# Patient Record
Sex: Female | Born: 1960 | Race: White | Hispanic: No | Marital: Married | Smoking: Current some day smoker
Health system: Southern US, Community
[De-identification: ages and names within clinical notes are randomized; demographics above are authoritative.]

## PROBLEM LIST (undated history)

## (undated) HISTORY — PX: ABDOMINAL HYSTERECTOMY: SUR658

## (undated) HISTORY — PX: CHOLECYSTECTOMY: SHX55

---

## 2011-12-03 LAB — CK TOTAL AND CKMB (NOT AT ARMC)
CK, Total: 129 U/L (ref 21–215)
CK-MB: 0.8 ng/mL (ref 0.5–3.6)

## 2011-12-03 LAB — CBC
HCT: 35.5 % (ref 35.0–47.0)
MCH: 31.4 pg (ref 26.0–34.0)
MCHC: 33.9 g/dL (ref 32.0–36.0)
Platelet: 182 10*3/uL (ref 150–440)
RDW: 13.5 % (ref 11.5–14.5)
WBC: 6.1 10*3/uL (ref 3.6–11.0)

## 2011-12-03 LAB — COMPREHENSIVE METABOLIC PANEL
Albumin: 3.4 g/dL (ref 3.4–5.0)
Anion Gap: 10 (ref 7–16)
BUN: 12 mg/dL (ref 7–18)
Bilirubin,Total: 0.2 mg/dL (ref 0.2–1.0)
Chloride: 110 mmol/L — ABNORMAL HIGH (ref 98–107)
EGFR (Non-African Amer.): >60
Glucose: 108 mg/dL — ABNORMAL HIGH (ref 65–99)
Osmolality: 285 (ref 275–301)
Potassium: 3.3 mmol/L — ABNORMAL LOW (ref 3.5–5.1)
SGPT (ALT): 60 U/L
Total Protein: 6.5 g/dL (ref 6.4–8.2)

## 2011-12-03 LAB — PROTIME-INR
INR: 0.9
Prothrombin Time: 12.7 secs (ref 11.5–14.7)

## 2011-12-04 ENCOUNTER — Observation Stay: Payer: Self-pay | Admitting: Specialist

## 2011-12-04 LAB — COMPREHENSIVE METABOLIC PANEL
Albumin: 3.3 g/dL — ABNORMAL LOW (ref 3.4–5.0)
Alkaline Phosphatase: 91 U/L (ref 50–136)
Anion Gap: 8 (ref 7–16)
Bilirubin,Total: 0.2 mg/dL (ref 0.2–1.0)
Chloride: 109 mmol/L — ABNORMAL HIGH (ref 98–107)
Co2: 25 mmol/L (ref 21–32)
Creatinine: 0.59 mg/dL — ABNORMAL LOW (ref 0.60–1.30)
Potassium: 3.8 mmol/L (ref 3.5–5.1)
SGOT(AST): 57 U/L — ABNORMAL HIGH (ref 15–37)
SGPT (ALT): 52 U/L
Sodium: 142 mmol/L (ref 136–145)

## 2011-12-04 LAB — DRUG SCREEN, URINE
Barbiturates, Ur Screen: NEGATIVE (ref ?–200)
Cannabinoid 50 Ng, Ur ~~LOC~~: NEGATIVE (ref ?–50)
MDMA (Ecstasy)Ur Screen: NEGATIVE (ref ?–500)
Methadone, Ur Screen: NEGATIVE (ref ?–300)
Phencyclidine (PCP) Ur S: NEGATIVE (ref ?–25)

## 2011-12-04 LAB — LIPID PANEL
Cholesterol: 146 mg/dL (ref 0–200)
HDL Cholesterol: 60 mg/dL (ref 40–60)
Triglycerides: 91 mg/dL (ref 0–200)

## 2011-12-04 LAB — TROPONIN I
Troponin-I: <0.02 ng/mL
Troponin-I: <0.02 ng/mL

## 2011-12-04 LAB — CK TOTAL AND CKMB (NOT AT ARMC)
CK, Total: 102 U/L (ref 21–215)
CK-MB: 0.8 ng/mL (ref 0.5–3.6)
CK-MB: <0.5 ng/mL — ABNORMAL LOW (ref 0.5–3.6)

## 2014-11-29 NOTE — Discharge Summary (Signed)
PATIENT NAME:  Shelby Thompson, Shelby Thompson MR#:  [id number] DATE OF BIRTH:  [birth year]  DATE OF ADMISSION:  12/04/2011 DATE OF DISCHARGE:  12/04/2011  For a detailed note, please take a look at the history and physical done by Dr. Margie EgePhichith at admission.    DIAGNOSES AT DISCHARGE:  1. Chest pain and left arm pain, likely musculoskeletal in nature.  2. History of hypertension, noncompliant with meds. 3. Ongoing tobacco abuse.  4. Gastroesophageal reflux disease.   DIET: The patient is being discharged on a low sodium diet.   ACTIVITY: As tolerated.   FOLLOW-UP: Follow-up is with [facility] at [facility].   DISCHARGE MEDICATIONS (Both of which are new medications): 1. Metoprolol 25 mg b.i.d.  2. Omeprazole 20 mg daily.   CONSULTANT DURING THE HOSPITAL COURSE: Dr. Arnoldo HookerBruce Kowalski from Cardiology    PERTINENT STUDIES:  1. Chest x-ray done on admission showing clear lung fields with slight cardiomegaly.  2. Stress test done on 12/04/2011 showing normal Lexiscan infusion, EKG without evidence of ischemia or arrhythmia, normal LV function, EF of 62%.   HOSPITAL COURSE: This is a 54 year old female with medical problems as mentioned above who presented to the hospital complaining of chest pain.  1. Chest pain. The patient was observed overnight on telemetry, had three sets of cardiac markers checked which were negative. The patient's chest pain seemed musculoskeletal in nature as it was reproducible and worse with certain movements. The patient did undergo a stress test which showed no evidence of acute myocardial ischemia. She is, therefore, being discharged home with follow-up with her primary care physician.  2. Hypertension. The patient did have labile blood pressures. The patient says that she is supposed to be taking antihypertensives but has not been compliant with them in over four months. I did start her on some low dose metoprolol. She will follow-up with her primary care  physician regarding any further changes regarding her blood pressure meds.  3. Tobacco abuse. The patient was strongly advised to quit smoking. She was maintained on a nicotine patch while she was here.  4. Gastroesophageal reflux disease. 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 She has developed some dyspepsia indigestion to this. I did start her on some omeprazole prior to being discharged and I did advise that she limit her intake of NSAIDs and Motrin as stated.   CODE STATUS: The patient is a FULL CODE.   TIME SPENT: 35 minutes.   ____________________________ Rolly PancakeVivek J. Cherlynn KaiserSainani, MD vjs:drc D: 12/04/2011 16:51:18 ET T: 12/05/2011 11:02:07 ET JOB#: [id number]  cc: Rolly PancakeVivek J. Cherlynn KaiserSainani, MD, <Dictator> Houston SirenVIVEK J SAINANI MD ELECTRONICALLY SIGNED 12/07/2011 8:15

## 2014-11-29 NOTE — Consult Note (Signed)
PATIENT NAME:  Shelby Thompson, Shelby Thompson MR#:  [id number] DATE OF BIRTH:  April 20, 1961  DATE OF CONSULTATION:  12/04/2011  REFERRING PHYSICIAN:   CONSULTING PHYSICIAN:  Lamar Blinks, MD  PRIMARY CARE PHYSICIAN: [facility]   CHIEF COMPLAINT: "I have chest pain."   HISTORY OF PRESENT ILLNESS: This is a 54 year old female with known hypertension, hyperlipidemia, off and on medications but reasonably well stable, had some cardiac work-up last year with a stress test, but no evidence of cardiac catheterization at that time. At that time, she had chest pain and it was called stress. Since then she has had no evidence of significant issues. She was sitting in her chair having substernal chest discomfort, diaphoresis, shortness of breath, and mild nausea. At that time she had left arm pain as well.  On arrival to the Emergency Room, troponin and CK-MB were within normal limits and she has had an EKG showing normal sinus rhythm, normal EKG. The patient has had no further episodes of chest discomfort since she was given some nitroglycerin and other appropriate medications. The patient has had now some continued arm pain, but no chest pain.  The remainder of review of systems is negative for vision change, ringing in the ears, hearing loss, cough, congestion, heartburn, nausea, vomiting, diarrhea, bloody stools, stomach pain, extremity pain, leg weakness, cramping in the buttocks, known blood clots, headaches, blackouts, dizzy spells, nosebleed, congestion, trouble swallowing, frequent urination, urination at night, muscle weakness, numbness, anxiety, or depression, skin lesions, or skin rashes.   PAST MEDICAL HISTORY:  1. Unstable angina.  2. Hypertension.  3. Hyperlipidemia.   FAMILY HISTORY: No family members with early onset of cardiovascular disease, but significant onset of stroke and hypertension.   SOCIAL HISTORY: Occasionally uses alcohol and tobacco.   ALLERGIES: She has no known drug allergies.   CURRENT  MEDICATIONS: As listed.   PHYSICAL EXAMINATION:  VITAL SIGNS: Blood pressure 126/68 bilaterally, heart rate 72 upright, reclining, and regular.   GENERAL: She is a well-appearing female in no acute distress.   HEENT: No icterus, thyromegaly, ulcers, hemorrhage, or xanthelasma.   HEART: Regular and rhythm with normal S1 and S2 without murmur, gallop, or rub. Point of maximal impulse is normal size and placement. Carotid upstroke normal without bruit. Jugular venous pressure is normal.   LUNGS: Lungs have few basilar crackles with normal respirations.   ABDOMEN: Soft, nontender, without hepatosplenomegaly or masses. Abdominal aorta is normal size without bruit.   EXTREMITIES: 2+ bilateral pulses in dorsal pedal, radial, and femoral arteries without lower extremity edema, cyanosis, clubbing, or ulcers.   NEUROLOGIC: She is oriented to time, place, and person with normal mood and affect.   ASSESSMENT: This is a 54 year old female with hypertension, hyperlipidemia, acute onset of substernal chest pain and diaphoresis consistent with unstable angina without evidence of current myocardial infarction.   RECOMMENDATIONS:  1. Continue current medical regimen, nitrates, oxygen and aspirin for further risk reduction of unstable angina. 2. Continue following serial ECG and enzymes for possible myocardial infarction.  3. Treadmill Myoview to assess exercise tolerance, myocardial ischemia, further treatment options.  4. Consider echocardiogram for LV systolic dysfunction and valvular heart disease.  5. Further diagnostic testing and treatment options after above.  6. Hypertension control with a goal systolic blood pressure below 811 mm with ACE inhibitor if able.   7. Continue lipid management with a goal LDL below 100 mg/dL with statin if able.      ____________________________ Lamar Blinks, MD bjk:bjt D: 12/04/2011 08:50:00  ET T: 12/04/2011 10:52:05 ET JOB#: [id number]  cc: Lamar BlinksBruce J.  Solange Emry, MD, <Dictator> Lamar BlinksBRUCE J [place] Warzecha MD ELECTRONICALLY SIGNED 12/14/2011 13:33

## 2014-11-29 NOTE — H&P (Signed)
PATIENT NAME:  Shelby Thompson, Shelby Thompson MR#:  [id number] DATE OF BIRTH:  1960/09/12  DATE OF ADMISSION:  12/03/2011  REFERRING PHYSICIAN: Dr. Mayford Knife PRIMARY CARE PHYSICIAN: [facility]  PRESENTING COMPLAINT: Chest pain, shortness of breath, nausea, vomiting, diaphoresis.   HISTORY OF PRESENT ILLNESS: Shelby Thompson is a pleasant 54 year old woman with history of hypertension, hyperlipidemia, obesity, vitamin D deficiency who presents today with reports of developing midsternal chest pain while lying on the couch watching TV described as being sharp at first with increased intensity and tightness like someone was stabbing her. She reports associated nausea, vomiting, diaphoresis, and shortness of breath. Reports the pain radiated down her left arm. She took one nitroglycerin that she was prescribed about a year ago without any help. When EMS arrived she was given aspirin and nitro spray and reports that her symptoms resolved. Denies any syncope. Reports a similar episode back in June 2012 where she was evaluated at [facility]. At that time she was working at her desk and she did have a syncopal spell. They did multiple work-ups but did not perform a catheterization at that time. Currently, she reports chest pain when she takes deep breaths and her pain is reproducible with deep palpation but has no chest pain when she is laying still.   PAST MEDICAL HISTORY:  1. Vitamin D deficiency.  2. Obesity.  3. Chronic leg pain.  4. Hypertension.  5. Hyperlipidemia.  6. Colonic polyps.  PAST SURGICAL HISTORY: Hysterectomy.   ALLERGIES: Amoxicillin and penicillin.   MEDICATIONS:  1. Vitamins over the counter.  2. Ibuprofen 800 mg b.i.d. since June 2012.   FAMILY HISTORY: Mother with stroke at the age of 40 and passed away from stroke at 81. There is significant family history of obesity. On her mother's side, there is early cardiac death between age of 57s 14s and she has a first cousin that died of a heart attack in teen years, 59  and 34 years old. Her father is still living. He is legally blind and has a blood disorder.   SOCIAL HISTORY: She lives in [place] with her husband. She smokes when she drinks, about a pack with drinking. She drinks eight beers in one night, but that is about every two weeks. Denies any drug use.   REVIEW OF SYSTEMS: CONSTITUTIONAL: No fevers, weight changes. EYES: No glaucoma, cataracts. ENT: No epistaxis, discharge. RESPIRATORY: No cough, wheezing, hemoptysis. CARDIOVASCULAR: As per history of present illness. Denies any palpitations or syncope. GASTROINTESTINAL: She endorsed nausea and vomiting with the chest pain as per history of present illness. No diarrhea, abdominal pain, hematemesis. She endorses bright red blood per rectum that has been chronic. She had a colonoscopy back in 2010 and had colonic polyps resected. GENITOURINARY: No dysuria, hematuria. ENDO: No polyuria, polydipsia. HEMATOLOGIC: No easy bruising. SKIN: No ulcers. She recently had a resolved ringworm lesion on her face and buttock. MUSCULOSKELETAL: She has chronic leg and knee pain and swelling. NEUROLOGIC: No history of strokes or seizures. PSYCH: Denies any depression or suicidal ideation.   PHYSICAL EXAMINATION:  VITAL SIGNS: Pulse 102, current pulse in the 70s, respiratory rate 18, blood pressure 162/77, saturating at 99% on room air.   GENERAL: Obese, sitting in bed in no apparent distress.   HEENT: Normocephalic, atraumatic. Pupils equal, symmetric, nonicteric. Nares without discharge. Moist mucous membrane.   NECK: Soft and supple. No adenopathy or JVP.   CARDIOVASCULAR: Non-tachy. No murmurs, rubs, or gallops.   LUNGS: Clear to auscultation bilaterally. No use of  accessory muscles or increased respiratory effort.   ABDOMEN: Soft. Positive bowel sounds. No mass appreciated.   EXTREMITIES: Trace edema bilaterally. Dorsal pedis pulses intact.   MUSCULOSKELETAL: Cannot detect any effusion. She does have pain with  palpation of her knees and reproducible pain on palpation of her chest.   NEUROLOGIC: No dysarthria or aphasia. Symmetrical strength. No focal deficits.   PSYCH: She is alert and oriented. Patient is cooperative.   LABORATORY, DIAGNOSTIC AND RADIOLOGICAL DATA: Troponin less than 0.02. CK 129, MB 0.8, INR 0.9, PTT 24.1, WBC 6.1, hemoglobin 12, hematocrit 35.5, platelets 182, MCV 93, glucose 108, BUN 12, creatinine 0.74, sodium 143, potassium 3.3, chloride 110, carbon dioxide 23, calcium 8.8, total bilirubin 0.2, alkaline phosphatase 90, ALT 60, AST 126. EKG with normal sinus rate of 67. No ST elevation or depression. She has T wave inversion in aVR and V1.   ASSESSMENT AND PLAN: Shelby Thompson is a 54 year old woman with history of hypertension, hyperlipidemia, obesity, tobacco use, alcohol use, vitamin D deficiency presenting with reports of chest pain, nausea, vomiting, diaphoresis, and shortness of breath.  1. Angina. Patient with multiple risk factors. Continue on tele. Cycle cardiac enzymes. Will start her on aspirin, metoprolol, nitroglycerin sublingual as needed, statin and morphine as needed as well as continued oxygen. Obtain cardiology consultation evaluation for potential catheterization. Will stop her ibuprofen for now. She has been on chronic ibuprofen since June 2012. I wonder if this may be some GI component. Will also start her empirically on Protonix b.i.d. Will send TSH, fasting lipid panel, and A1c for risk stratification.  2. Hypertension. She has not been compliant with medications. Reports that she is supposed to be on medications but not taking them. Unknown what medications are. Will start metoprolol for now.  3. Hyperlipidemia. Also not on medications. Will send fasting lipid panel as above, start on statin therapy.  4. Hypokalemia. Send mag level and replace as needed.  5. Elevated AST likely in the setting of alcohol use. Continue to follow.  6. Tobacco use and alcohol use. Again,  she smokes when she drinks and she does not drink daily but does drink heavily when she does. Will start on CIWA protocol and nicotine patch for now. Will also send a urine drug screen.  ____________________________ Reuel DerbyAlounthith Aliene Tamura, MD ap:cms D: 12/03/2011 23:46:18 ET T: 12/04/2011 08:20:26 ET JOB#: [id number]  cc: Pearlean BrownieAlounthith Emmitt Matthews, MD, <Dictator> [facility] MD ELECTRONICALLY SIGNED 12/06/2011 22:36

## 2018-10-24 ENCOUNTER — Ambulatory Visit
Admission: EM | Admit: 2018-10-24 | Discharge: 2018-10-24 | Disposition: A | Discharge status: Home / Self Care | Payer: BC Managed Care – PPO | Attending: Emergency Medicine | Admitting: Emergency Medicine

## 2018-10-24 ENCOUNTER — Other Ambulatory Visit: Payer: Self-pay

## 2018-10-24 DIAGNOSIS — I159 Secondary hypertension, unspecified: Secondary | ICD-10-CM

## 2018-10-24 DIAGNOSIS — F17210 Nicotine dependence, cigarettes, uncomplicated: Secondary | ICD-10-CM | POA: Diagnosis not present

## 2018-10-24 DIAGNOSIS — R05 Cough: Secondary | ICD-10-CM | POA: Diagnosis not present

## 2018-10-24 DIAGNOSIS — R0982 Postnasal drip: Secondary | ICD-10-CM

## 2018-10-24 DIAGNOSIS — R0981 Nasal congestion: Secondary | ICD-10-CM | POA: Diagnosis not present

## 2018-10-24 DIAGNOSIS — J0140 Acute pansinusitis, unspecified: Secondary | ICD-10-CM

## 2018-10-24 LAB — RAPID INFLUENZA A&B ANTIGENS (ARMC ONLY): INFLUENZA B (ARMC): NEGATIVE

## 2018-10-24 LAB — RAPID INFLUENZA A&B ANTIGENS: Influenza A (ARMC): NEGATIVE

## 2018-10-24 MED ORDER — DOXYCYCLINE HYCLATE 100 MG PO CAPS: 100.0000 mg | ORAL_CAPSULE | Freq: Two times a day (BID) | ORAL | 0 refills | Status: AC

## 2018-10-24 MED ORDER — FLUTICASONE PROPIONATE 50 MCG/ACT NA SUSP: 2.0000 | Freq: Every day | NASAL | 0 refills | Status: DC

## 2018-10-24 NOTE — Discharge Instructions (Signed)
Take the medication as written. Start Mucinex to keep the mucous thin and to decongest you.  Return to the ER if you get worse, have a fever >100.4, or for any concerns. You may take 600 mg of motrin with 1 gram of tylenol up to 3-4 times a day as needed for pain. This is an effective combination for pain.  Most sinus infections are viral and do not need antibiotics unless you have a high fever, have had this for 10 days, or you get better and then get sick again.  If you are not getting better in a day or 2, then go ahead and start the antibiotics.  Use a NeilMed sinus rinse as often as you want to to reduce nasal congestion. Follow the directions on the box.   Decrease your salt intake. diet and exercise will lower your blood pressure significantly. It is important to keep your blood pressure under good control, as having a elevated blood pressure for prolonged periods of time significantly increases your risk of stroke, heart attacks, kidney damage, eye damage, and other problems. Measure your blood pressure once a day, preferably at the same time every day. Keep a log of this and bring it to your next doctor's appointment.  Bring your blood pressure cuff as well.  Return here in a week for blood pressure recheck if you're unable to find a primary care physician by then. Return immediately to the ER if you start having chest pain, headache, problems seeing, problems talking, problems walking, if you feel like you're about to pass out, if you do pass out, if you have a seizure, or for any other concerns.  Here is a list of primary care providers who are taking new patients:  Dr. Elizabeth Sauer, Dr. Schuyler Amor [address] [place] [address] [phone]  [facility] [address] [place] [address]  [phone]  [facility] [address] [place], [place] [address] [phone]  [facility] [address] [place]  [phone] [place], [place]  [address]  Here are clinics/ other resources who will see you if you do not have insurance. Some have certain criteria that you must meet. Call them and find out what they are:  [facility]: [address] [place], [place] [address] Phone: [phone] Hours: First and Third Saturdays of each Month, 9 a.m. - 1 p.m.  [facility]: [address] [place], [place] [address] Phone: [phone] Hours: Tuesday, 4 p.m. - 8 p.m. Thursday, 1 p.m. - 8 p.m. Wednesday, 9 a.m. - [facility] [address], [place], [place] [address] Phone: [phone] Pharmacy Phone Number: [phone] Dental Phone Number: [phone] [facility] Insurance Help: [phone]  Dental Hours: Monday - Thursday, 8 a.m. - 6 p.m.  [facility] [address] [place], [place] [address] Phone: [phone] Pharmacy Phone Number: [phone] [facility] Insurance Help: [phone]  [facility] [address] [place] [place], [place] [address] Phone: [phone] Pharmacy Phone Number: [phone] [facility] Insurance Help: [phone]  [facility] [address] [place], [place] [address] Phone: [phone] [facility] Insurance Help: [phone]   [facility] [address], [place], [place] [address] Phone: [phone]  Go to www.goodrx.com to look up your medications. This will give you a list of where you can find your prescriptions at the most affordable prices. Or ask the pharmacist what the cash price is, or if  they have any other discount programs available to help make your medication more affordable. This can be less expensive than what you would pay with insurance.

## 2018-10-24 NOTE — ED Provider Notes (Signed)
HPI  SUBJECTIVE:  Shelby Thompson is a 57 y.o. female who presents with "URI" symptoms starting 3 days ago.  She reports fevers T-max 101, nasal congestion, green rhinorrhea.  She reports a cough productive of greenish phlegm primarily in the morning.  She reports maxillary sinus pain and pressure, mild facial swelling, postnasal drip.  She reports chest congestion, wheezing in the morning secondary to the postnasal drip.  No sore throat.  No body aches, headaches, chest pain, shortness of breath, dyspnea on exertion.  She did not get a flu shot this year.  No contacts with flu.  No antibiotics in the past month.  No antipyretic in the past 4 to 6 hours.  No recent travel, no exposure to COVID 19 patient.  She tried TheraFlu without improvement in her symptoms.  Symptoms are worse with lying down.  She has a past medical history of asthma "years ago" and has not needed an inhaler in 20 years.  She is an occasional smoker.  She has a history of hypertension, but has not taken her medications in a month.  She also has a history of frequent sinusitis.  No history of diabetes.  PMD: Urgent care in [place].  Is looking for new primary care provider.   History reviewed. No pertinent past medical history.  Past Surgical History:  Procedure Laterality Date  . ABDOMINAL HYSTERECTOMY    . CHOLECYSTECTOMY      Family History  Problem Relation Age of Onset  . Breast cancer Mother   . Bone cancer Father     Social History   Tobacco Use  . Smoking status: Current Some Day Smoker    Types: Cigarettes  . Smokeless tobacco: Never Used  Substance Use Topics  . Alcohol use: Yes    Comment: rarely  . Drug use: Never    No current facility-administered medications for this encounter.   Current Outpatient Medications:  .  doxycycline (VIBRAMYCIN) 100 MG capsule, Take 1 capsule (100 mg total) by mouth 2 (two) times daily for 7 days., Disp: 14 capsule, Rfl: 0 .  fluticasone (FLONASE) 50 MCG/ACT nasal spray,  Place 2 sprays into both nostrils daily., Disp: 16 g, Rfl: 0  Allergies  Allergen Reactions  . Amoxicillin Hives     ROS  As noted in HPI.   Physical Exam  BP (!) 162/72 (BP Location: Left Arm)   Pulse 70   Temp 98.9 F (37.2 C) (Oral)   Resp 16   Ht 5\' 2"  (1.575 m)   Wt 93 kg   SpO2 98%   BMI 37.49 kg/m   Constitutional: Well developed, well nourished, no acute distress Eyes:  EOMI, conjunctiva normal bilaterally HENT: Normocephalic, atraumatic,mucus membranes moist.  Positive nasal congestion.  Normal turbinates.  Positive maxillary and frontal sinus tenderness.  Normal oropharynx without cobblestoning or postnasal drip. Respiratory: Normal inspiratory effort, lungs clear bilaterally, good air movement Cardiovascular: Normal rate, regular rhythm, no murmurs rubs or gallops GI: nondistended skin: No rash, skin intact Musculoskeletal: no deformities Neurologic: Alert & oriented x 3, no focal neuro deficits Psychiatric: Speech and behavior appropriate   ED Course   Medications - No data to display  Orders Placed This Encounter  Procedures  . Rapid Influenza A&B Antigens ([facility] only)    Standing Status:   Standing    Number of Occurrences:   1  . Droplet precaution    Standing Status:   Standing    Number of Occurrences:   1  Results for orders placed or performed during the hospital encounter of 10/24/18 (from the past 24 hour(s))  Rapid Influenza A&B Antigens ([facility] only)     Status: None   Collection Time: 10/24/18 10:07 AM  Result Value Ref Range   Influenza A ([facility]) NEGATIVE NEGATIVE   Influenza B ([facility]) NEGATIVE NEGATIVE   No results found.  ED Clinical Impression  Acute non-recurrent pansinusitis  Secondary hypertension   ED Assessment/Plan  Checking flu.  If negative, plan below.  1.  URI/sinusitis.  Most likely viral at this point in time but given the sinus tenderness and the severity of symptoms that she is reporting, will send home  with see prescription of doxycycline in addition to Flonase, saline nasal irrigation, plain Mucinex, Tylenol/ibuprofen 3 or 4 times a day as needed for fever, headache.  Rapid flu negative.  Plan as above  2.  Hypertension.  Currently asymptomatic.  Advised her to restart her blood pressure medicine.  Advised her to buy a blood pressure cuff and keep a log of her blood pressure.  We will have her follow-up with a primary care physician of her choice for ongoing management of this.  Discussed labs, MDM, treatment plan, and plan for follow-up with patient. Discussed sn/sx that should prompt return to the ED. patient agrees with plan.   Meds ordered this encounter  Medications  . fluticasone (FLONASE) 50 MCG/ACT nasal spray    Sig: Place 2 sprays into both nostrils daily.    Dispense:  16 g    Refill:  0  . doxycycline (VIBRAMYCIN) 100 MG capsule    Sig: Take 1 capsule (100 mg total) by mouth 2 (two) times daily for 7 days.    Dispense:  14 capsule    Refill:  0    *This clinic note was created using Scientist, clinical (histocompatibility and immunogenetics). Therefore, there may be occasional mistakes despite careful proofreading.   ?   Domenick Gong, MD 10/24/18 1054

## 2018-10-24 NOTE — ED Triage Notes (Signed)
Patient complains of cough, congestion, fever, nasal congestion x 1 week.

## 2018-11-26 ENCOUNTER — Encounter: Payer: Self-pay | Admitting: Emergency Medicine

## 2018-11-26 ENCOUNTER — Other Ambulatory Visit: Payer: Self-pay

## 2018-11-26 ENCOUNTER — Ambulatory Visit
Admission: EM | Admit: 2018-11-26 | Discharge: 2018-11-26 | Disposition: A | Discharge status: Home / Self Care | Payer: BC Managed Care – PPO | Attending: Family Medicine | Admitting: Family Medicine

## 2018-11-26 DIAGNOSIS — M545 Low back pain, unspecified: Secondary | ICD-10-CM

## 2018-11-26 MED ORDER — MELOXICAM 15 MG PO TABS: 15.0000 mg | ORAL_TABLET | Freq: Every day | ORAL | 0 refills | Status: DC | PRN

## 2018-11-26 MED ORDER — METAXALONE 800 MG PO TABS: 800.0000 mg | ORAL_TABLET | Freq: Three times a day (TID) | ORAL | 0 refills | Status: DC | PRN

## 2018-11-26 NOTE — ED Provider Notes (Signed)
[facility]    CSN: [id number] Arrival date & time: 11/26/18  0859  History   Chief Complaint Chief Complaint  Patient presents with  . Back Pain   HPI  58 year old female presents with low back pain.  Patient recently seen and admitted to the hospital in January for back pain.  MRI revealed degenerative changes as well as retrolisthesis and disc bulge at L5-S1.  Patient states that she was discharged home on physical therapy.  She states that she has since ceased physical therapy.  Patient states that she was doing a cardiac cath this morning.  She got out of bed and had severe pain when she stepped down from her bed.  Pain is located bilaterally, lumbar spine.  No reports of difficulty urinating.  No incontinence.  No saddle anesthesia.  No numbness or tingling.  Pain is severe.  Worse with activity/movement.  No relieving factors.  No medications or interventions tried.  No other complaints.  PMH, Surgical Hx, Family Hx, Social History reviewed and updated as below.  PMH: Depression, HLD, HTN, Migraine  Past Surgical History:  Procedure Laterality Date  . ABDOMINAL HYSTERECTOMY    . CHOLECYSTECTOMY     Home Medications    Prior to Admission medications   Medication Sig Start Date End Date Taking? Authorizing Provider  fluticasone (FLONASE) 50 MCG/ACT nasal spray Place 2 sprays into both nostrils daily. 10/24/18   Domenick Gong, MD  meloxicam (MOBIC) 15 MG tablet Take 1 tablet (15 mg total) by mouth daily as needed for pain. 11/26/18   Tommie Sams, DO  metaxalone (SKELAXIN) 800 MG tablet Take 1 tablet (800 mg total) by mouth 3 (three) times daily as needed for muscle spasms. 11/26/18   Tommie Sams, DO    Family History Family History  Problem Relation Age of Onset  . Breast cancer Mother   . Bone cancer Father     Social History Social History   Tobacco Use  . Smoking status: Current Some Day Smoker    Types: Cigarettes  . Smokeless tobacco: Never  Used  Substance Use Topics  . Alcohol use: Yes    Comment: rarely  . Drug use: Never     Allergies   Amoxicillin   Review of Systems Review of Systems  Constitutional: Negative.   Musculoskeletal: Positive for back pain.  Neurological: Negative.    Physical Exam Triage Vital Signs ED Triage Vitals  Enc Vitals Group     BP 11/26/18 0917 (!) 158/91     Pulse Rate 11/26/18 0917 67     Resp 11/26/18 0917 18     Temp 11/26/18 0917 97.9 F (36.6 C)     Temp Source 11/26/18 0917 Oral     SpO2 11/26/18 0917 97 %     Weight 11/26/18 0916 204 lb (92.5 kg)     Height 11/26/18 0916 5\' 2"  (1.575 m)     Head Circumference --      Peak Flow --      Pain Score 11/26/18 0915 10     Pain Loc --      Pain Edu? --      Excl. in GC? --    Updated Vital Signs BP (!) 158/91 (BP Location: Right Arm)   Pulse 67   Temp 97.9 F (36.6 C) (Oral)   Resp 18   Ht 5\' 2"  (1.575 m)   Wt 92.5 kg   SpO2 97%   BMI 37.31 kg/m  Visual Acuity Right Eye Distance:   Left Eye Distance:   Bilateral Distance:    Right Eye Near:   Left Eye Near:    Bilateral Near:     Physical Exam Vitals signs and nursing note reviewed.  Constitutional:      General: She is not in acute distress.    Appearance: Normal appearance. She is obese.  HENT:     Head: Normocephalic and atraumatic.  Eyes:     General:        Right eye: No discharge.        Left eye: No discharge.     Conjunctiva/sclera: Conjunctivae normal.  Cardiovascular:     Rate and Rhythm: Normal rate and regular rhythm.  Pulmonary:     Effort: Pulmonary effort is normal.     Breath sounds: Normal breath sounds.  Musculoskeletal:     Comments: Lumbar spine with Paraspinal muscular tension and tenderness to palpation.  Neurological:     Mental Status: She is alert.  Psychiatric:        Mood and Affect: Mood normal.        Behavior: Behavior normal.    UC Treatments / Results  Labs (all labs ordered are listed, but only abnormal  results are displayed) Labs Reviewed - No data to display  EKG None  Radiology No results found.  Procedures Procedures (including critical care time)  Medications Ordered in UC Medications - No data to display  Initial Impression / Assessment and Plan / UC Course  I have reviewed the triage vital signs and the nursing notes.  Pertinent labs & imaging results that were available during my care of the patient were reviewed by me and considered in my medical decision making (see chart for details).     58 year old female presents with low back pain.  Treating with meloxicam and Skelaxin.  Final Clinical Impressions(s) / UC Diagnoses   Final diagnoses:  Bilateral low back pain without sciatica, unspecified chronicity     Discharge Instructions     Rest.  Heat.  Medication as directed.  Take care  Dr. Adriana Simasook    ED Prescriptions    Medication Sig Dispense Auth. Provider   meloxicam (MOBIC) 15 MG tablet Take 1 tablet (15 mg total) by mouth daily as needed for pain. 30 tablet Anyssa Sharpless G, DO   metaxalone (SKELAXIN) 800 MG tablet Take 1 tablet (800 mg total) by mouth 3 (three) times daily as needed for muscle spasms. 30 tablet Tommie Samsook, Tarvaris Puglia G, DO     Controlled Substance Prescriptions [place] Controlled Substance Registry consulted? Not Applicable   Tommie SamsCook, Jaxsen Bernhart G, DO 11/26/18 1030

## 2018-11-26 NOTE — Discharge Instructions (Signed)
Rest.  Heat.  Medication as directed.  Take care  Dr. Kynzee Devinney  

## 2018-11-26 NOTE — ED Triage Notes (Signed)
Patient c/o back pain that started this morning after stepping down from her bed. She has had problems with her back before.

## 2018-12-16 ENCOUNTER — Other Ambulatory Visit: Payer: Self-pay | Admitting: Family Medicine

## 2024-08-01 ENCOUNTER — Ambulatory Visit
Admission: EM | Admit: 2024-08-01 | Discharge: 2024-08-01 | Disposition: A | Discharge status: Home / Self Care | Attending: Nurse Practitioner | Admitting: Nurse Practitioner

## 2024-08-01 DIAGNOSIS — I10 Essential (primary) hypertension: Secondary | ICD-10-CM

## 2024-08-01 DIAGNOSIS — R079 Chest pain, unspecified: Secondary | ICD-10-CM | POA: Diagnosis not present

## 2024-08-01 DIAGNOSIS — J09X2 Influenza due to identified novel influenza A virus with other respiratory manifestations: Secondary | ICD-10-CM | POA: Diagnosis not present

## 2024-08-01 DIAGNOSIS — G43019 Migraine without aura, intractable, without status migrainosus: Secondary | ICD-10-CM | POA: Diagnosis not present

## 2024-08-01 DIAGNOSIS — R52 Pain, unspecified: Secondary | ICD-10-CM

## 2024-08-01 LAB — POCT INFLUENZA A/B
Influenza A, POC: POSITIVE — AB
Influenza B, POC: NEGATIVE

## 2024-08-01 MED ORDER — IBUPROFEN 800 MG PO TABS: 800.0000 mg | ORAL_TABLET | Freq: Three times a day (TID) | ORAL | 0 refills | Status: AC | PRN

## 2024-08-01 MED ORDER — OSELTAMIVIR PHOSPHATE 75 MG PO CAPS: 75.0000 mg | ORAL_CAPSULE | Freq: Two times a day (BID) | ORAL | 0 refills | Status: AC

## 2024-08-01 MED ORDER — KETOROLAC TROMETHAMINE 30 MG/ML IJ SOLN
30.0000 mg | Freq: Once | INTRAMUSCULAR | Status: AC
  Administered 2024-08-01: 30 mg via INTRAMUSCULAR

## 2024-08-01 MED ORDER — CETIRIZINE-PSEUDOEPHEDRINE ER 5-120 MG PO TB12: 1.0000 | ORAL_TABLET | Freq: Every day | ORAL | 0 refills | Status: AC

## 2024-08-01 MED ORDER — BUTALBITAL-APAP-CAFFEINE 50-325-40 MG PO TABS: 1.0000 | ORAL_TABLET | Freq: Four times a day (QID) | ORAL | 0 refills | Status: AC | PRN

## 2024-08-01 MED ORDER — AMLODIPINE BESYLATE 5 MG PO TABS: 5.0000 mg | ORAL_TABLET | Freq: Every day | ORAL | 0 refills | Status: AC

## 2024-08-01 MED ORDER — DEXAMETHASONE SOD PHOSPHATE PF 10 MG/ML IJ SOLN
10.0000 mg | Freq: Once | INTRAMUSCULAR | Status: AC
  Administered 2024-08-01: 10 mg via INTRAMUSCULAR

## 2024-08-01 NOTE — Discharge Instructions (Addendum)
 You tested positive for influenza, which is a viral illness. Antibiotics are not needed. You were prescribed Tamiflu  to help shorten the duration of symptoms and should take it as directed. Rest, good hydration, and time are important for recovery. Warm fluids, a humidifier, and saline nasal spray may help with congestion. Small, frequent sips of fluids can help if postnasal drainage causes nausea.  You were treated in the clinic for a migraine headache, and symptoms should continue to improve over the next 24 to 48 hours. Rest in a quiet, dark environment and stay well hydrated. Fioricet has been prescribed. You may take this as needed for headache only. The prescribed ibuprofen  can be taken for any fevers or body aches.   Your chest pain was evaluated and does not appear to be heart-related at this time. Your EKG was normal, and the pain is most likely related to your flu symptoms. Your blood pressure was elevated today without signs of an emergency. You should restart your amlodipine  and take it daily as prescribed, along with following a heart-healthy, low-salt diet.  Follow up with your primary care provider within 1 to 2 weeks for blood pressure management or sooner if symptoms do not improve. Go to the emergency department if you develop worsening shortness of breath, persistent or worsening chest pain, new weakness or numbness, confusion, fainting, persistent high fever, severe headache that does not improve, or inability to keep fluids down.

## 2024-08-01 NOTE — ED Provider Notes (Signed)
 " [facility]    CSN: [id number] Arrival date & time: 08/01/24  9166      History   Chief Complaint Chief Complaint  Patient presents with   Cough   Chest Pain    HPI Shelby Thompson is a 63 y.o. female.   Discussed the use of AI scribe software for clinical note transcription with the patient, who gave verbal consent to proceed.   The patient with a history of hypertension presents with acute onset of flu-like symptoms that began two days ago. She reports chills and generalized body aches. She also endorses chest pain located in the mid-sternal region, described as a stick and needle sensation, with intermittent radiation down the left arm. The chest pain is not constant and she notes partial relief with shaking her arms.  She reports significant nasal congestion, rhinorrhea, and frequent sneezing, as well as postnasal drainage associated with nausea. She has experienced a persistent migraine headache since symptom onset. She denies fever, sore throat, vomiting, diarrhea, wheezing, shortness of breath, significant cough (occurs only occasionally), numbness or tingling of the face or extremities, or dizziness.  She reports baseline left lower extremity swelling related to left knee surgery in July, with no acute changes. She has been taking DayQuil and NyQuil for symptom relief. The patient also reports nonadherence to her prescribed amlodipine  for approximately two months, stating she stopped taking it because she got tired of taking it.  The following sections of the patient's history were reviewed and updated as appropriate: allergies, current medications, past family history, past medical history, past social history, past surgical history, and problem list.     History reviewed. No pertinent past medical history.  There are no active problems to display for this patient.   Past Surgical History:  Procedure Laterality Date   ABDOMINAL HYSTERECTOMY      CHOLECYSTECTOMY      OB History   No obstetric history on file.      Home Medications    Prior to Admission medications  Medication Sig Start Date End Date Taking? Authorizing Provider  amLODipine  (NORVASC ) 5 MG tablet Take 1 tablet (5 mg total) by mouth daily. 08/01/24  Yes Iola Lukes, FNP  butalbital -acetaminophen-caffeine  (FIORICET) 50-325-40 MG tablet Take 1 tablet by mouth every 6 (six) hours as needed for headache. Do not to exceed 6 tablets per 24 hour period. Limit use to no more than 2 days per week to avoid rebound (medication-overuse) headaches 08/01/24 08/01/25 Yes Mikale Silversmith, FNP  cetirizine -pseudoephedrine  (ZYRTEC -D) 5-120 MG tablet Take 1 tablet by mouth daily with breakfast for 10 days. 08/01/24 08/11/24 Yes Louanna Vanliew, FNP  ibuprofen  (ADVIL ) 800 MG tablet Take 1 tablet (800 mg total) by mouth every 8 (eight) hours as needed (pain). Take with food to avoid stomach upset. Do not take any additional NSAIDs while on this. You may take tylenol in addition to this if needed for extra pain relief. 08/01/24  Yes Iola Lukes, FNP  oseltamivir  (TAMIFLU ) 75 MG capsule Take 1 capsule (75 mg total) by mouth every 12 (twelve) hours for 5 days. 08/01/24 08/06/24 Yes Iola Lukes, FNP    Family History Family History  Problem Relation Age of Onset   Breast cancer Mother    Bone cancer Father     Social History Social History[1]   Allergies   Amoxicillin   Review of Systems Review of Systems  Constitutional:  Positive for chills. Negative for fever.  HENT:  Positive for congestion, postnasal drip, rhinorrhea  and sneezing. Negative for sore throat.   Respiratory:  Positive for cough (occasional, mild). Negative for shortness of breath and wheezing.   Cardiovascular:  Positive for chest pain (intermittent) and leg swelling (chronic lower left leg swelling since having knee surgery in July 2025; unchaged).  Gastrointestinal:  Positive for nausea.  Negative for diarrhea and vomiting.  Musculoskeletal:  Positive for myalgias.  Neurological:  Positive for headaches (history of migraines). Negative for dizziness and numbness.  All other systems reviewed and are negative.    Physical Exam Triage Vital Signs ED Triage Vitals  Encounter Vitals Group     BP 08/01/24 0908 (!) 170/81     Girls Systolic BP Percentile --      Girls Diastolic BP Percentile --      Boys Systolic BP Percentile --      Boys Diastolic BP Percentile --      Pulse Rate 08/01/24 0908 80     Resp 08/01/24 0908 16     Temp 08/01/24 0908 99.4 F (37.4 C)     Temp Source 08/01/24 0908 Oral     SpO2 08/01/24 0908 99 %     Weight 08/01/24 0910 213 lb 11.2 oz (96.9 kg)     Height --      Head Circumference --      Peak Flow --      Pain Score 08/01/24 0910 9     Pain Loc --      Pain Education --      Exclude from Growth Chart --    No data found.  Updated Vital Signs BP (!) 170/81 (BP Location: Left Arm)   Pulse 80   Temp 99.4 F (37.4 C) (Oral)   Resp 16   Wt 213 lb 11.2 oz (96.9 kg)   SpO2 99%   BMI 39.09 kg/m   Visual Acuity Right Eye Distance:   Left Eye Distance:   Bilateral Distance:    Right Eye Near:   Left Eye Near:    Bilateral Near:     Physical Exam Vitals reviewed.  Constitutional:      General: She is awake. She is not in acute distress.    Appearance: Normal appearance. She is well-developed. She is ill-appearing. She is not toxic-appearing or diaphoretic.  HENT:     Head: Normocephalic.     Right Ear: Hearing, tympanic membrane, ear canal and external ear normal. No drainage, swelling or tenderness. No middle ear effusion. Tympanic membrane is not erythematous.     Left Ear: Hearing, tympanic membrane, ear canal and external ear normal. No drainage, swelling or tenderness.  No middle ear effusion. Tympanic membrane is not erythematous.     Nose: Rhinorrhea present. Rhinorrhea is clear.     Mouth/Throat:     Lips: Pink.      Mouth: Mucous membranes are moist.     Pharynx: Oropharynx is clear. Uvula midline. No pharyngeal swelling, oropharyngeal exudate, posterior oropharyngeal erythema or uvula swelling.     Tonsils: No tonsillar exudate or tonsillar abscesses.  Eyes:     General: Vision grossly intact.     Conjunctiva/sclera: Conjunctivae normal.  Cardiovascular:     Rate and Rhythm: Normal rate and regular rhythm.     Pulses: Normal pulses.     Heart sounds: Normal heart sounds.  Pulmonary:     Effort: Pulmonary effort is normal. No tachypnea or respiratory distress.     Breath sounds: Normal breath sounds and air entry.  Comments: Respirations even and unlabored  Chest:     Chest wall: No tenderness.  Abdominal:     Palpations: Abdomen is soft.  Musculoskeletal:        General: Normal range of motion.     Cervical back: Normal range of motion and neck supple.     Right lower leg: No edema.     Left lower leg: No edema.  Lymphadenopathy:     Cervical: Cervical adenopathy present.  Skin:    General: Skin is warm and dry.  Neurological:     General: No focal deficit present.     Mental Status: She is alert and oriented to person, place, and time.     Sensory: Sensation is intact. No sensory deficit.     Motor: Motor function is intact. No weakness.     Coordination: Coordination is intact.     Gait: Gait is intact.  Psychiatric:        Mood and Affect: Mood and affect normal.        Speech: Speech normal.        Behavior: Behavior normal. Behavior is cooperative.      UC Treatments / Results  Labs (all labs ordered are listed, but only abnormal results are displayed) Labs Reviewed  POCT INFLUENZA A/B - Abnormal; Notable for the following components:      Result Value   Influenza A, POC Positive (*)    All other components within normal limits    EKG   Radiology No results found.  Procedures ED EKG  Date/Time: 08/01/2024 9:37 AM  Performed by: Iola Lukes,  FNP Authorized by: Iola Lukes, FNP   Previous ECG:    Previous ECG:  Compared to current (April 2013)   Similarity:  No change Interpretation:    Interpretation: normal   Rate:    ECG rate:  72   ECG rate assessment: normal   Rhythm:    Rhythm: sinus rhythm   Ectopy:    Ectopy: none   QRS:    QRS axis:  Normal   QRS intervals:  Normal   QRS conduction: normal   ST segments:    ST segments:  Normal T waves:    T waves: normal   Q waves:    Abnormal Q-waves: not present    (including critical care time)  Medications Ordered in UC Medications  ketorolac  (TORADOL ) 30 MG/ML injection 30 mg (30 mg Intramuscular Given 08/01/24 1004)  dexamethasone  (DECADRON ) injection 10 mg (10 mg Intramuscular Given 08/01/24 1005)    Initial Impression / Assessment and Plan / UC Course  I have reviewed the triage vital signs and the nursing notes.  Pertinent labs & imaging results that were available during my care of the patient were reviewed by me and considered in my medical decision making (see chart for details).     The patient presents with acute respiratory symptoms and tested positive for influenza. Clinical presentation is consistent with an acute viral illness without evidence of secondary bacterial infection. Supportive care measures were reviewed, including adequate hydration, rest, and the use of over-the-counter antipyretics and analgesics. Oseltamivir  (Tamiflu ) was prescribed based on symptom onset and the presence of risk factors.  The patient also reported an intractable migraine headache and was treated in clinic with ketorolac  and dexamethasone , with symptom management discussed. Chest pain is felt to be non-cardiac in origin and likely related to the acute viral illness, as EKG was normal and unchanged from prior studies, and the  HEART score was calculated at 2, indicating low risk for acute coronary syndrome.  The patient has a known history of hypertension with  reported noncompliance with prescribed therapy. Blood pressure was elevated today without evidence of hypertensive urgency or emergency. Amlodipine  (Norvasc ) was restarted, and the patient was counseled on medication adherence and the importance of a heart-healthy diet.  Clear return precautions were reviewed, including instructions to seek immediate care for worsening respiratory symptoms, persistent high fever, chest pain, or inability to tolerate oral fluids.  Today's evaluation has revealed no signs of a dangerous process. Discussed diagnosis with patient and/or guardian. Patient and/or guardian aware of their diagnosis, possible red flag symptoms to watch out for and need for close follow up. Patient and/or guardian understands verbal and written discharge instructions. Patient and/or guardian comfortable with plan and disposition.  Patient and/or guardian has a clear mental status at this time, good insight into illness (after discussion and teaching) and has clear judgment to make decisions regarding their care  Documentation was completed with the aid of voice recognition software. Transcription may contain typographical errors.   Final Clinical Impressions(s) / UC Diagnoses   Final diagnoses:  Generalized body aches  Chest pain, unspecified type  Influenza due to identified novel influenza A virus with other respiratory manifestations  Intractable migraine without aura and without status migrainosus  Elevated blood pressure reading with diagnosis of hypertension     Discharge Instructions      You tested positive for influenza, which is a viral illness. Antibiotics are not needed. You were prescribed Tamiflu  to help shorten the duration of symptoms and should take it as directed. Rest, good hydration, and time are important for recovery. Warm fluids, a humidifier, and saline nasal spray may help with congestion. Small, frequent sips of fluids can help if postnasal drainage causes  nausea.  You were treated in the clinic for a migraine headache, and symptoms should continue to improve over the next 24 to 48 hours. Rest in a quiet, dark environment and stay well hydrated. Fioricet has been prescribed. You may take this as needed for headache only. The prescribed ibuprofen  can be taken for any fevers or body aches.   Your chest pain was evaluated and does not appear to be heart-related at this time. Your EKG was normal, and the pain is most likely related to your flu symptoms. Your blood pressure was elevated today without signs of an emergency. You should restart your amlodipine  and take it daily as prescribed, along with following a heart-healthy, low-salt diet.  Follow up with your primary care provider within 1 to 2 weeks for blood pressure management or sooner if symptoms do not improve. Go to the emergency department if you develop worsening shortness of breath, persistent or worsening chest pain, new weakness or numbness, confusion, fainting, persistent high fever, severe headache that does not improve, or inability to keep fluids down.     ED Prescriptions     Medication Sig Dispense Auth. Provider   butalbital -acetaminophen-caffeine  (FIORICET) 50-325-40 MG tablet Take 1 tablet by mouth every 6 (six) hours as needed for headache. Do not to exceed 6 tablets per 24 hour period. Limit use to no more than 2 days per week to avoid rebound (medication-overuse) headaches 12 tablet Iola Lukes, FNP   ibuprofen  (ADVIL ) 800 MG tablet Take 1 tablet (800 mg total) by mouth every 8 (eight) hours as needed (pain). Take with food to avoid stomach upset. Do not take any additional NSAIDs while on  this. You may take tylenol in addition to this if needed for extra pain relief. 21 tablet Iola Lukes, FNP   oseltamivir  (TAMIFLU ) 75 MG capsule Take 1 capsule (75 mg total) by mouth every 12 (twelve) hours for 5 days. 10 capsule Iola Lukes, FNP   amLODipine  (NORVASC ) 5 MG  tablet Take 1 tablet (5 mg total) by mouth daily. 60 tablet Iola Lukes, FNP   cetirizine -pseudoephedrine  (ZYRTEC -D) 5-120 MG tablet Take 1 tablet by mouth daily with breakfast for 10 days. 10 tablet Iola Lukes, FNP      PDMP not reviewed this encounter.     [1]  Social History Tobacco Use   Smoking status: Some Days    Types: Cigarettes   Smokeless tobacco: Never  Vaping Use   Vaping status: Never Used  Substance Use Topics   Alcohol use: Yes    Comment: rarely   Drug use: Never     Iola Lukes, FNP 08/01/24 1034  "

## 2024-08-01 NOTE — ED Triage Notes (Addendum)
 Patient presents to UC for cough, HA, body aches, SOB, nasal congestion, nausea, dizziness x 3 days. Reports chest pain that radiates to left arm x 2 days ago. States 9/10 pain and pain at the center of chest. Reports pain improves when in supine position. Hx of HTN but does not take her medications. Reports in the past she had to take nitroglycerin for chest pain.
